# Patient Record
Sex: Female | Born: 1955 | Race: Black or African American | Hispanic: No | Marital: Married | Smoking: Former smoker
Health system: Southern US, Community
[De-identification: ages and names within clinical notes are randomized; demographics above are authoritative.]

## PROBLEM LIST (undated history)

## (undated) DIAGNOSIS — S32040A Wedge compression fracture of fourth lumbar vertebra, initial encounter for closed fracture: Secondary | ICD-10-CM

## (undated) DIAGNOSIS — K219 Gastro-esophageal reflux disease without esophagitis: Secondary | ICD-10-CM

## (undated) DIAGNOSIS — C7931 Secondary malignant neoplasm of brain: Secondary | ICD-10-CM

## (undated) DIAGNOSIS — R569 Unspecified convulsions: Secondary | ICD-10-CM

## (undated) DIAGNOSIS — C7951 Secondary malignant neoplasm of bone: Secondary | ICD-10-CM

## (undated) DIAGNOSIS — R627 Adult failure to thrive: Secondary | ICD-10-CM

## (undated) DIAGNOSIS — C7A1 Malignant poorly differentiated neuroendocrine tumors: Secondary | ICD-10-CM

## (undated) DIAGNOSIS — E876 Hypokalemia: Secondary | ICD-10-CM

## (undated) DIAGNOSIS — K7689 Other specified diseases of liver: Secondary | ICD-10-CM

## (undated) DIAGNOSIS — T7840XA Allergy, unspecified, initial encounter: Secondary | ICD-10-CM

## (undated) DIAGNOSIS — I82409 Acute embolism and thrombosis of unspecified deep veins of unspecified lower extremity: Secondary | ICD-10-CM

## (undated) DIAGNOSIS — K769 Liver disease, unspecified: Secondary | ICD-10-CM

## (undated) DIAGNOSIS — C787 Secondary malignant neoplasm of liver and intrahepatic bile duct: Secondary | ICD-10-CM

## (undated) DIAGNOSIS — K5900 Constipation, unspecified: Secondary | ICD-10-CM

## (undated) HISTORY — DX: Secondary malignant neoplasm of bone: C79.51

## (undated) HISTORY — DX: Malignant poorly differentiated neuroendocrine tumors: C7A.1

## (undated) HISTORY — PX: ANTERIOR LUMBAR CORPECTOMY W/ FUSION: SHX1167

## (undated) HISTORY — DX: Secondary malignant neoplasm of brain: C79.31

## (undated) HISTORY — DX: Unspecified convulsions: R56.9

## (undated) HISTORY — DX: Hypokalemia: E87.6

## (undated) HISTORY — PX: TOTAL SHOULDER ARTHROPLASTY: SHX126

## (undated) HISTORY — DX: Other specified diseases of liver: K76.89

## (undated) HISTORY — PX: OTHER SURGICAL HISTORY: SHX169

## (undated) HISTORY — DX: Allergy, unspecified, initial encounter: T78.40XA

## (undated) HISTORY — PX: THORACIC LAMINECTOMY: SHX96

## (undated) HISTORY — DX: Acute embolism and thrombosis of unspecified deep veins of unspecified lower extremity: I82.409

## (undated) HISTORY — DX: Constipation, unspecified: K59.00

## (undated) HISTORY — DX: Gastro-esophageal reflux disease without esophagitis: K21.9

## (undated) HISTORY — DX: Liver disease, unspecified: K76.9

## (undated) HISTORY — DX: Hypomagnesemia: E83.42

## (undated) HISTORY — DX: Adult failure to thrive: R62.7

## (undated) HISTORY — DX: Wedge compression fracture of fourth lumbar vertebra, initial encounter for closed fracture: S32.040A

## (undated) HISTORY — DX: Secondary malignant neoplasm of liver and intrahepatic bile duct: C78.7

---

## 2016-01-17 DIAGNOSIS — R918 Other nonspecific abnormal finding of lung field: Secondary | ICD-10-CM | POA: Insufficient documentation

## 2016-01-17 DIAGNOSIS — G9389 Other specified disorders of brain: Secondary | ICD-10-CM | POA: Insufficient documentation

## 2016-01-17 DIAGNOSIS — R569 Unspecified convulsions: Secondary | ICD-10-CM | POA: Insufficient documentation

## 2016-01-31 DIAGNOSIS — C787 Secondary malignant neoplasm of liver and intrahepatic bile duct: Secondary | ICD-10-CM | POA: Insufficient documentation

## 2016-01-31 DIAGNOSIS — C7A8 Other malignant neuroendocrine tumors: Secondary | ICD-10-CM | POA: Insufficient documentation

## 2016-01-31 DIAGNOSIS — C7931 Secondary malignant neoplasm of brain: Secondary | ICD-10-CM | POA: Insufficient documentation

## 2016-02-07 DIAGNOSIS — C7951 Secondary malignant neoplasm of bone: Secondary | ICD-10-CM | POA: Insufficient documentation

## 2016-02-14 DIAGNOSIS — E876 Hypokalemia: Secondary | ICD-10-CM | POA: Insufficient documentation

## 2016-04-14 DIAGNOSIS — M4856XA Collapsed vertebra, not elsewhere classified, lumbar region, initial encounter for fracture: Secondary | ICD-10-CM | POA: Insufficient documentation

## 2016-05-16 DIAGNOSIS — M4326 Fusion of spine, lumbar region: Secondary | ICD-10-CM | POA: Insufficient documentation

## 2016-08-28 DIAGNOSIS — L24A9 Irritant contact dermatitis due friction or contact with other specified body fluids: Secondary | ICD-10-CM | POA: Insufficient documentation

## 2016-08-28 DIAGNOSIS — T148XXA Other injury of unspecified body region, initial encounter: Secondary | ICD-10-CM | POA: Insufficient documentation

## 2016-09-01 DIAGNOSIS — Z9889 Other specified postprocedural states: Secondary | ICD-10-CM | POA: Insufficient documentation

## 2017-12-10 ENCOUNTER — Encounter (HOSPITAL_COMMUNITY): Payer: Self-pay | Admitting: Nurse Practitioner

## 2017-12-10 ENCOUNTER — Emergency Department (HOSPITAL_COMMUNITY)
Admission: EM | Admit: 2017-12-10 | Discharge: 2017-12-10 | Disposition: A | Discharge status: Home / Self Care | Payer: BLUE CROSS/BLUE SHIELD | Attending: Emergency Medicine | Admitting: Emergency Medicine

## 2017-12-10 ENCOUNTER — Other Ambulatory Visit: Payer: Self-pay

## 2017-12-10 ENCOUNTER — Emergency Department (HOSPITAL_COMMUNITY): Payer: BLUE CROSS/BLUE SHIELD

## 2017-12-10 DIAGNOSIS — R410 Disorientation, unspecified: Secondary | ICD-10-CM | POA: Diagnosis not present

## 2017-12-10 DIAGNOSIS — C7931 Secondary malignant neoplasm of brain: Secondary | ICD-10-CM | POA: Diagnosis not present

## 2017-12-10 DIAGNOSIS — T7621XA Adult sexual abuse, suspected, initial encounter: Secondary | ICD-10-CM | POA: Diagnosis present

## 2017-12-10 DIAGNOSIS — Z79899 Other long term (current) drug therapy: Secondary | ICD-10-CM | POA: Insufficient documentation

## 2017-12-10 MED ORDER — RIVAROXABAN 20 MG PO TABS: 20.0000 mg | ORAL_TABLET | Freq: Every day | ORAL | Status: DC

## 2017-12-10 MED ORDER — SODIUM CHLORIDE 0.9 % IJ SOLN
20.00 | INTRAMUSCULAR | Status: DC
Start: 2017-12-09 — End: 2017-12-10

## 2017-12-10 MED ORDER — ACETAMINOPHEN 325 MG PO TABS
650.0000 mg | ORAL_TABLET | Freq: Once | ORAL | Status: DC
  Filled 2017-12-10: qty 2

## 2017-12-10 MED ORDER — LEVETIRACETAM 500 MG PO TABS
1000.0000 mg | ORAL_TABLET | Freq: Once | ORAL | Status: AC
  Administered 2017-12-10: 1000 mg via ORAL
  Filled 2017-12-10: qty 2

## 2017-12-10 MED ORDER — MAGNESIUM OXIDE 400 MG PO TABS
400.00 | ORAL_TABLET | ORAL | Status: DC
Start: 2017-12-09 — End: 2017-12-10

## 2017-12-10 MED ORDER — ONDANSETRON HCL 8 MG PO TABS
8.00 | ORAL_TABLET | ORAL | Status: DC
Start: ? — End: 2017-12-10

## 2017-12-10 MED ORDER — RIVAROXABAN 20 MG PO TABS
20.00 | ORAL_TABLET | ORAL | Status: DC
Start: 2017-12-09 — End: 2017-12-10

## 2017-12-10 MED ORDER — QUETIAPINE FUMARATE 25 MG PO TABS
12.50 | ORAL_TABLET | ORAL | Status: DC
Start: 2017-12-09 — End: 2017-12-10

## 2017-12-10 MED ORDER — DEXAMETHASONE 2 MG PO TABS
1.00 | ORAL_TABLET | ORAL | Status: DC
Start: 2017-12-10 — End: 2017-12-10

## 2017-12-10 MED ORDER — RIVAROXABAN 20 MG PO TABS
20.0000 mg | ORAL_TABLET | Freq: Once | ORAL | Status: AC
  Administered 2017-12-10: 20 mg via ORAL
  Filled 2017-12-10: qty 1

## 2017-12-10 MED ORDER — SODIUM CHLORIDE 0.9 % IJ SOLN
20.00 | INTRAMUSCULAR | Status: DC
Start: ? — End: 2017-12-10

## 2017-12-10 MED ORDER — LACTULOSE 10 GM/15ML PO SOLN
10.00 g | ORAL | Status: DC
Start: 2017-12-09 — End: 2017-12-10

## 2017-12-10 MED ORDER — LEVETIRACETAM 500 MG PO TABS
1000.00 | ORAL_TABLET | ORAL | Status: DC
Start: 2017-12-09 — End: 2017-12-10

## 2017-12-10 MED ORDER — PANTOPRAZOLE SODIUM 40 MG PO TBEC
40.00 | DELAYED_RELEASE_TABLET | ORAL | Status: DC
Start: 2017-12-10 — End: 2017-12-10

## 2017-12-10 NOTE — Discharge Instructions (Signed)
Follow up as needed
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
## 2017-12-10 NOTE — ED Notes (Signed)
[place] concerning [facility] pickup for patient, patient is still on the list for pick up but no ETA is able to be given.
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
## 2017-12-10 NOTE — ED Provider Notes (Signed)
I was asked by RN to order medications that patient typically takes at night for patient. Patient is awaiting [facility] transfer and has been waiting for approximately 5 hours.  Patient requires Keppra and Xarelto. These are the most urgent doses for patient. Will order these for patient.   Albesa Seen, PA-C 12/11/17 [id number]    Charlesetta Shanks, MD 12/12/17 1012
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
## 2017-12-10 NOTE — ED Notes (Signed)
[facility] updated on transportation for patient

## 2017-12-11 ENCOUNTER — Non-Acute Institutional Stay (SKILLED_NURSING_FACILITY): Payer: BLUE CROSS/BLUE SHIELD | Admitting: Adult Health

## 2017-12-11 ENCOUNTER — Encounter: Payer: Self-pay | Admitting: Adult Health

## 2017-12-11 DIAGNOSIS — C7931 Secondary malignant neoplasm of brain: Secondary | ICD-10-CM

## 2017-12-11 DIAGNOSIS — C7951 Secondary malignant neoplasm of bone: Secondary | ICD-10-CM

## 2017-12-11 DIAGNOSIS — K219 Gastro-esophageal reflux disease without esophagitis: Secondary | ICD-10-CM | POA: Diagnosis not present

## 2017-12-11 DIAGNOSIS — C787 Secondary malignant neoplasm of liver and intrahepatic bile duct: Secondary | ICD-10-CM

## 2017-12-11 DIAGNOSIS — R569 Unspecified convulsions: Secondary | ICD-10-CM

## 2017-12-11 DIAGNOSIS — C7A8 Other malignant neuroendocrine tumors: Secondary | ICD-10-CM | POA: Diagnosis not present

## 2017-12-11 NOTE — Progress Notes (Signed)
Location:   [place] Room Number: [place] of Service:  SNF (31)   CODE STATUS: Full Code  No Known Allergies  Chief Complaint  Patient presents with  . 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 She is no longer a candidate for chemo or radiation therapy and has less than 6 months to live. The goal of her care is for rehab while able and then transition to comfort care. She will need a palliative care consult at this time. She is unable to participate in the hpi or ros.    Past Medical History:  Diagnosis Date  . Failure to thrive in adult   . GERD (gastroesophageal reflux disease)   . Seizures ([place])     History reviewed. No pertinent surgical history.  Social History   Socioeconomic History  . Marital status: Married    Spouse name: Not on file  . Number of children: Not on file  . Years of education: Not on file  . Highest education level: Not on file  Social Needs  . Financial resource strain: Not on file  . Food insecurity - worry: Not on file  . Food insecurity - inability: Not on file  . Transportation needs - medical: Not on file  . Transportation needs - non-medical: Not on file  Occupational History  . Not on file  Tobacco Use  . Smoking status: Unknown If Ever Smoked  . Smokeless tobacco: Never Used  Substance and Sexual Activity  . Alcohol use: No    Frequency: Never  . Drug use: No  . Sexual activity: Not on file  Other Topics Concern  . Not on file  Social History Narrative  . Not on file   History reviewed. No pertinent family history.    VITAL SIGNS BP 118/88   Pulse (!) 102   Temp 98.1 F (36.7 C)   Resp 18   Ht 5' 5"  (1.651 m)   Wt 240 lb (108.9 kg) Comment: from hospital  BMI 39.94 kg/m   Outpatient Encounter Medications as of 12/11/2017  Medication Sig  . alum & mag hydroxide-simeth  (MAG-AL PLUS XS) 400-400-40 MG/5ML suspension Take 15 mLs by mouth every 8 (eight) hours as needed for indigestion.  . calcium-vitamin D (OSCAL WITH D) 500-200 MG-UNIT tablet Take 1 tablet by mouth 2 (two) times daily.  Marland Kitchen dexamethasone (DECADRON) 1 MG tablet Take 1 mg by mouth daily with breakfast.  . lactulose (CHRONULAC) 10 GM/15ML solution Take 10 g by mouth 2 (two) times daily.  Marland Kitchen lidocaine-prilocaine (EMLA) cream Apply 1 application topically daily as needed (for port access).  Marland Kitchen omeprazole (PRILOSEC) 20 MG capsule Take 20 mg by mouth daily with breakfast.  . ondansetron (ZOFRAN) 8 MG tablet Take 8 mg by mouth every 8 (eight) hours as needed for nausea or vomiting.  . rivaroxaban (XARELTO) 20 MG TABS tablet Take 20 mg by mouth daily with supper.  . senna-docusate (SENOKOT-S) 8.6-50 MG tablet Take 2 tablets by mouth daily as needed for mild constipation.  . levETIRAcetam (KEPPRA) 1000 MG tablet Take 1,000 mg by mouth 2 (two) times daily.   No facility-administered encounter medications on file as of 12/11/2017.      SIGNIFICANT DIAGNOSTIC EXAMS  TODAY:   11-27-17: MRI of brain: 1. Again seen are 7 intracranial metastatic masses. Evaluation for progression is hindered  by the lack of contrast on the prior exam and motion limited exam today. There has been mild interval enlargement of a few of the lesions as described above mainly due to increased size of the centrally necrotic/hemorrhagic components. This enlargement is mild and there is no new solid enhancing nodularity to definitely confirm progression of disease as this may be treatment related. Consider attention on follow-up imaging. 2. 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 Mild improvement in edema on the bilateral frontal lobes could represent improving cerebral edema from the adjacent metastatic disease. 3. No acute infarct.  11-28-17: MRI liver 1.  Interval development of numerous hepatic lesions with interval increase in size of lesions from 10/22/2017, most consistent with progression of metastasis. 2. New 1.5 cm T10 vertebral body lesion, reflecting metastasis. 3. Interval development of additional foci of the restricted diffusion in the spleen, which are also concerning for metastasis.    LABS REVIEWED: TODAY:   11-30-17: vit B 12: 715; ammonia 54; tsh 1.589;  12-04-17: mag 1.8 12-07-17: wbc 5.6; hgb 11.8; hct 36.0; mcv 92.3; plt 242; glucose 92; bun 24; creat 0.68; k+ 4.5; na++ 138; ca 9.1; ast 79; alt 82; alk phos 268; albumin 3.5 12-09-17: wbc 4.8; hgb 11.9; hct 34.9; mcv 91.4; plt 224; glucose 81; bun 17; creat 0.63; k+ 4.1; na++ 135; ca 8.9; ast 81; alt 81; alk phos 271; albumin 3.3    Review of Systems  Unable to perform ROS: Other (confused )     Physical Exam  Constitutional: She appears well-developed and well-nourished. No distress.  Obese   Neck: No thyromegaly present.  Cardiovascular: Regular rhythm, normal heart sounds and intact distal pulses.  Tachycardic   Pulmonary/Chest: Effort normal. No respiratory distress.  Breath sounds diminished   Abdominal: Soft. Bowel sounds are normal. She exhibits distension. There is no tenderness.  Fluid present   Musculoskeletal: Normal range of motion. She exhibits no edema.  Lymphadenopathy:    She has no cervical adenopathy.  Neurological:  She is aware   Skin: Skin is warm and dry. She is not diaphoretic.    ASSESSMENT/ PLAN:  TODAY:   1. gerd without esophagitis: stable will continue prilosec 20 mg daily   2. Seizures: no reports of seizure activity present will continue keppra 1 gm twice daily   3. Neuroendocrine carcinoma of lung with liver; brain and osseous mets: she is declining: will continue decadron 1 mg daily and lactulose 15 mL twice daily   Time spent with patient and family: 45 minutes: we did discuss her poor prognosis; goals of care;  expectations and future care. Family verbalized understanding.   MD is aware of resident's narcotic use and is in agreement with current plan of care. We will attempt to wean resident as apropriate   Ok Edwards NP [facility]  Contact [phone] Monday through Friday 8am- 5pm  After hours call [phone]

## 2017-12-14 ENCOUNTER — Encounter: Payer: Self-pay | Admitting: Internal Medicine

## 2017-12-14 ENCOUNTER — Other Ambulatory Visit: Payer: Self-pay

## 2017-12-14 ENCOUNTER — Non-Acute Institutional Stay (SKILLED_NURSING_FACILITY): Payer: BLUE CROSS/BLUE SHIELD | Admitting: Internal Medicine

## 2017-12-14 DIAGNOSIS — C787 Secondary malignant neoplasm of liver and intrahepatic bile duct: Secondary | ICD-10-CM

## 2017-12-14 DIAGNOSIS — G893 Neoplasm related pain (acute) (chronic): Secondary | ICD-10-CM

## 2017-12-14 DIAGNOSIS — C7951 Secondary malignant neoplasm of bone: Secondary | ICD-10-CM

## 2017-12-14 DIAGNOSIS — C7800 Secondary malignant neoplasm of unspecified lung: Secondary | ICD-10-CM | POA: Diagnosis not present

## 2017-12-14 DIAGNOSIS — C7931 Secondary malignant neoplasm of brain: Secondary | ICD-10-CM | POA: Diagnosis not present

## 2017-12-14 DIAGNOSIS — R740 Nonspecific elevation of levels of transaminase and lactic acid dehydrogenase [LDH]: Secondary | ICD-10-CM

## 2017-12-14 DIAGNOSIS — D6481 Anemia due to antineoplastic chemotherapy: Secondary | ICD-10-CM

## 2017-12-14 DIAGNOSIS — R569 Unspecified convulsions: Secondary | ICD-10-CM | POA: Diagnosis not present

## 2017-12-14 DIAGNOSIS — R4182 Altered mental status, unspecified: Secondary | ICD-10-CM

## 2017-12-14 DIAGNOSIS — R7401 Elevation of levels of liver transaminase levels: Secondary | ICD-10-CM

## 2017-12-14 DIAGNOSIS — T451X5A Adverse effect of antineoplastic and immunosuppressive drugs, initial encounter: Secondary | ICD-10-CM

## 2017-12-14 DIAGNOSIS — E440 Moderate protein-calorie malnutrition: Secondary | ICD-10-CM

## 2017-12-14 DIAGNOSIS — R1310 Dysphagia, unspecified: Secondary | ICD-10-CM

## 2017-12-14 MED ORDER — MORPHINE SULFATE (CONCENTRATE) 20 MG/ML PO SOLN: ORAL | 0 refills | Status: AC
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
## 2017-12-14 NOTE — Telephone Encounter (Signed)
RX faxed to [facility] @ [phone], phone number [phone] 

## 2017-12-15 LAB — BASIC METABOLIC PANEL
BUN: 50 — AB (ref 4–21)
CREATININE: 1.9 — AB (ref 0.5–1.1)
Glucose: 93
Potassium: 5.7 — AB (ref 3.4–5.3)
Sodium: 138 (ref 137–147)

## 2017-12-15 NOTE — ED Notes (Signed)
Admin Chip at facility called to try and find out why a SANE exam was not preformed. Opened chart to try and determine what happened.

## 2017-12-16 ENCOUNTER — Non-Acute Institutional Stay (SKILLED_NURSING_FACILITY): Payer: BLUE CROSS/BLUE SHIELD | Admitting: Adult Health

## 2017-12-16 ENCOUNTER — Encounter: Payer: Self-pay | Admitting: Adult Health

## 2017-12-16 DIAGNOSIS — C787 Secondary malignant neoplasm of liver and intrahepatic bile duct: Secondary | ICD-10-CM

## 2017-12-16 DIAGNOSIS — C7951 Secondary malignant neoplasm of bone: Secondary | ICD-10-CM

## 2017-12-16 DIAGNOSIS — C7A8 Other malignant neuroendocrine tumors: Secondary | ICD-10-CM

## 2017-12-16 DIAGNOSIS — C7931 Secondary malignant neoplasm of brain: Secondary | ICD-10-CM

## 2017-12-27 DIAGNOSIS — K219 Gastro-esophageal reflux disease without esophagitis: Secondary | ICD-10-CM | POA: Insufficient documentation

## 2018-01-15 NOTE — Progress Notes (Signed)
Location:   [place] Room Number: [place] of Service:  SNF (31)   CODE STATUS: Full Code  No Known Allergies  Chief Complaint  Patient presents with  . Acute Visit    Change in status    HPI:  She has had a significant change in her status. She is no longer responsive to stimuli; she went from showing signs of distress; to on signs of distress in the past hour. Palliative care is present; family is present: we have had a prolonged discussion that her status is terminal and she has less that 24 hours of life left. Her husband and children have verbalized understanding. They have decided to change her code status to DNR.   Past Medical History:  Diagnosis Date  . Allergy   . Brain metastases ([place])   . Closed compression fracture of L4 lumbar vertebra ([place])   . Constipation   . DVT, lower extremity, recurrent, unspecified laterality ([place])   . Failure to thrive in adult   . GERD (gastroesophageal reflux disease)   . Hepatic dysfunction   . 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 Hypokalemia   . Hypomagnesemia   . Liver metastases ([place])   . Osseous metastasis ([place])   . Seizures ([place])     Past Surgical History:  Procedure Laterality Date  . ANTERIOR LUMBAR CORPECTOMY W/ FUSION    . lumbar compression    . orif right leg    . THORACIC LAMINECTOMY    . TOTAL SHOULDER ARTHROPLASTY      Social History   Socioeconomic History  . Marital status: Married    Spouse name: Not on file  . Number of children: Not on file  . Years of education: Not on file  . Highest education level: Not on file  Social Needs  . Financial resource strain: Not on file  . Food insecurity - worry: Not on file  . Food insecurity - inability: Not on file  . Transportation needs - medical: Not on file  . Transportation needs - non-medical: Not on file  Occupational History  . Not on file  Tobacco Use  . Smoking status: Former Research scientist (life sciences)  . Smokeless tobacco: Never  Used  Substance and Sexual Activity  . Alcohol use: No    Frequency: Never  . Drug use: No  . Sexual activity: Not on file  Other Topics Concern  . Not on file  Social History Narrative  . Not on file   History reviewed. No pertinent family history.    VITAL SIGNS Ht 5' 5"  (1.651 m)   Wt 240 lb 1.3 oz (108.9 kg)   BMI 39.95 kg/m    Outpatient Encounter Medications as of 03-Jan-2018  Medication Sig  . alum & mag hydroxide-simeth (MAG-AL PLUS XS) 400-400-40 MG/5ML suspension Take 15 mLs by mouth every 8 (eight) hours as needed for indigestion.  . calcium-vitamin D (OSCAL WITH D) 500-200 MG-UNIT tablet Take 1 tablet by mouth 2 (two) times daily.  Marland Kitchen dexamethasone (DECADRON) 1 MG tablet Take 1 mg by mouth daily with breakfast.  . lactulose (CHRONULAC) 10 GM/15ML solution Take 10 g by mouth 2 (two) times daily.  Marland Kitchen levETIRAcetam (KEPPRA) 100 MG/ML solution Give 10 ml by mouth two times a day  . lidocaine-prilocaine (EMLA) cream Apply 1 application topically daily as needed (for port access).  . morphine (ROXANOL) 20 MG/ML concentrated solution Give 0.25 ml (60m) by mouth every 4 hours as needed for  moderate / severe pain / headache  . omeprazole (PRILOSEC) 20 MG capsule Take 20 mg by mouth daily with breakfast.  . ondansetron (ZOFRAN) 8 MG tablet Take 8 mg by mouth every 8 (eight) hours as needed for nausea or vomiting.  . rivaroxaban (XARELTO) 20 MG TABS tablet Take 20 mg by mouth daily with supper.  . senna-docusate (SENOKOT-S) 8.6-50 MG tablet Take 2 tablets by mouth daily as needed for mild constipation.   No facility-administered encounter medications on file as of 01-07-2018.      SIGNIFICANT DIAGNOSTIC EXAMS  PREVIOUS:   11-27-17: MRI of brain: 1. Again seen are 7 intracranial metastatic masses. Evaluation for progression is hindered by the lack of contrast on the prior exam and motion limited exam today. There has been mild interval enlargement of a few of the lesions as  described above mainly due to increased size of the centrally necrotic/hemorrhagic components. This enlargement is mild and there is no new solid enhancing nodularity to definitely confirm progression of disease as this may be treatment related. Consider attention on follow-up imaging. 2. 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 Mild improvement in edema on the bilateral frontal lobes could represent improving cerebral edema from the adjacent metastatic disease. 3. No acute infarct.  11-28-17: MRI liver 1. Interval development of numerous hepatic lesions with interval increase in size of lesions from 10/22/2017, most consistent with progression of metastasis. 2. New 1.5 cm T10 vertebral body lesion, reflecting metastasis. 3. Interval development of additional foci of the restricted diffusion in the spleen, which are also concerning for metastasis.   NO NEW EXAMS   LABS REVIEWED: PREVIOUS:   11-30-17: vit B 12: 715; ammonia 54; tsh 1.589;  12-04-17: mag 1.8 12-07-17: wbc 5.6; hgb 11.8; hct 36.0; mcv 92.3; plt 242; glucose 92; bun 24; creat 0.68; k+ 4.5; na++ 138; ca 9.1; ast 79; alt 82; alk phos 268; albumin 3.5 12-09-17: wbc 4.8; hgb 11.9; hct 34.9; mcv 91.4; plt 224; glucose 81; bun 17; creat 0.63; k+ 4.1; na++ 135; ca 8.9; ast 81; alt 81; alk phos 271; albumin 3.3  NO NEW LABS     Review of Systems  Unable to perform ROS: Other (non responsive )    Physical Exam  Constitutional: No distress.  Cardiovascular:  Heart rate is tachycardic   Pulmonary/Chest: Breath sounds normal. No respiratory distress.  Shallow respirations   Abdominal: She exhibits distension.  Fluid present   Musculoskeletal: She exhibits no edema.  Neurological:  Not aware of surroundings   Skin: She is not diaphoretic.    ASSESSMENT/ PLAN:  TODAY:   1. Neuroendocrine carcinoma of lung with mets to liver; bone and brain;  Will  stop meds Will begin roxanol 5 mg every 4 hours routinely and every hour as needed Will begin ativan 0.5 mg every 4 hours as needed   ADENDUM   SHE HAS EXPIRED ONE HOUR AFTER SEEING HER FOR THIS PROGRESS NOTE    MD is aware of resident's narcotic use and is in agreement with current plan of care. We will attempt to wean resident as apropriate   Ok Edwards NP [facility]  Contact [phone] Monday through Friday 8am- 5pm  After hours call [phone]

## 2018-01-15 DEATH — deceased

## 2018-02-03 DIAGNOSIS — G893 Neoplasm related pain (acute) (chronic): Secondary | ICD-10-CM | POA: Insufficient documentation

## 2018-02-03 DIAGNOSIS — C7800 Secondary malignant neoplasm of unspecified lung: Secondary | ICD-10-CM | POA: Insufficient documentation

## 2018-02-03 DIAGNOSIS — D6481 Anemia due to antineoplastic chemotherapy: Secondary | ICD-10-CM | POA: Insufficient documentation

## 2018-02-03 DIAGNOSIS — R1310 Dysphagia, unspecified: Secondary | ICD-10-CM | POA: Insufficient documentation

## 2018-02-03 DIAGNOSIS — R740 Nonspecific elevation of levels of transaminase and lactic acid dehydrogenase [LDH]: Secondary | ICD-10-CM

## 2018-02-03 DIAGNOSIS — T451X5A Adverse effect of antineoplastic and immunosuppressive drugs, initial encounter: Secondary | ICD-10-CM

## 2018-02-03 DIAGNOSIS — R7401 Elevation of levels of liver transaminase levels: Secondary | ICD-10-CM | POA: Insufficient documentation

## 2018-05-27 IMAGING — CT CT HEAD W/O CM
3 series · 14 of 46 positions shown, 16 images · non-contrast
Comparison: 09/04/2017, 01/16/2016

CLINICAL DATA: 61-year-old female with a history of memory loss,
head trauma

EXAM:
CT HEAD WITHOUT CONTRAST
TECHNIQUE: Contiguous axial images were obtained from the base of the skull
through the vertex without intravenous contrast.

[Series 2: head wo · axial · 0.49mm/px · z∈[+1513,+1633]mm · 8 of 29 slices shown, 10 images]
[im 3/29  brain]
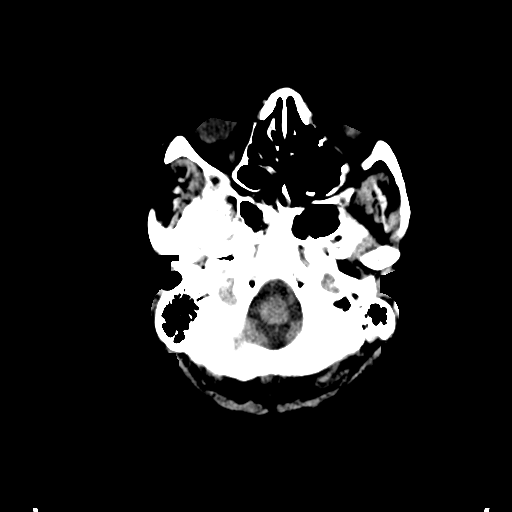
[im 3/29  bone]
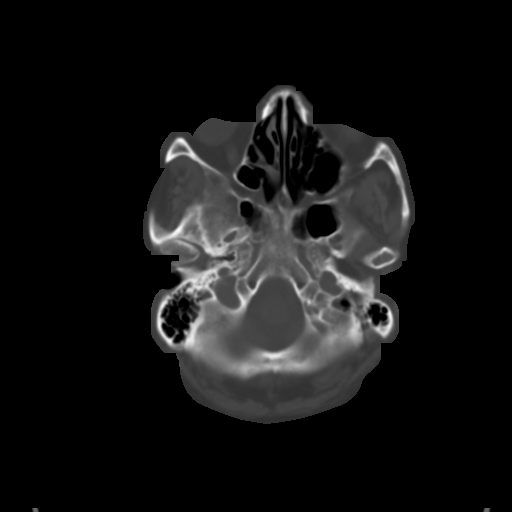
[im 7/29  brain]
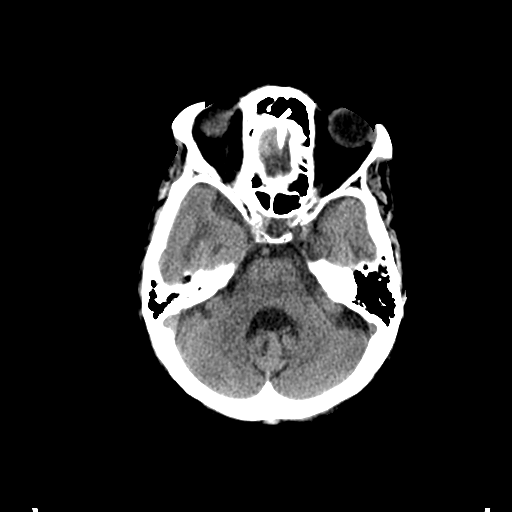
[im 10/29  brain]
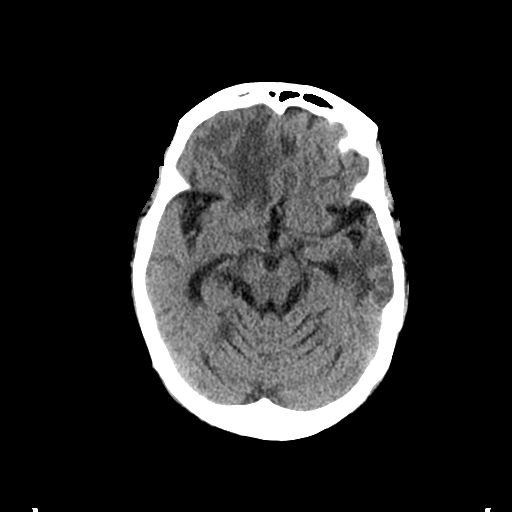
[im 13/29  brain]
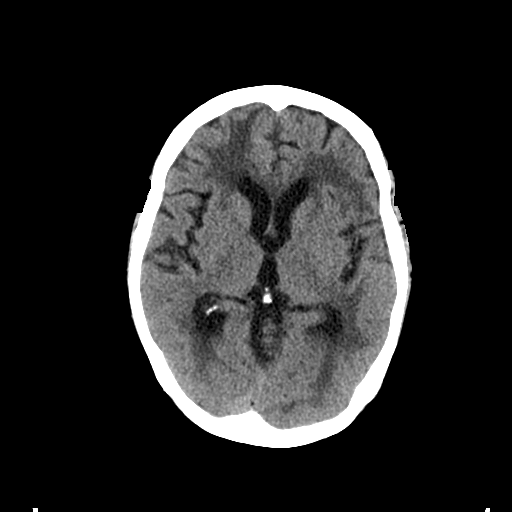
[im 17/29  brain]
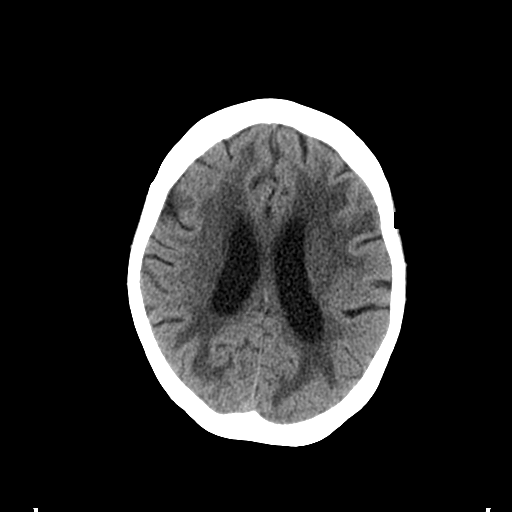
[im 17/29  bone]
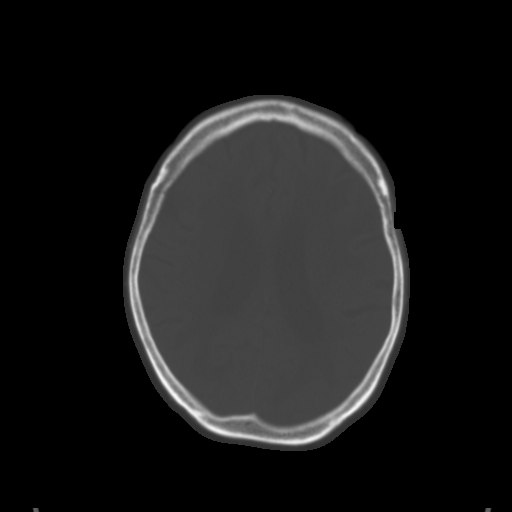
[im 20/29  brain]
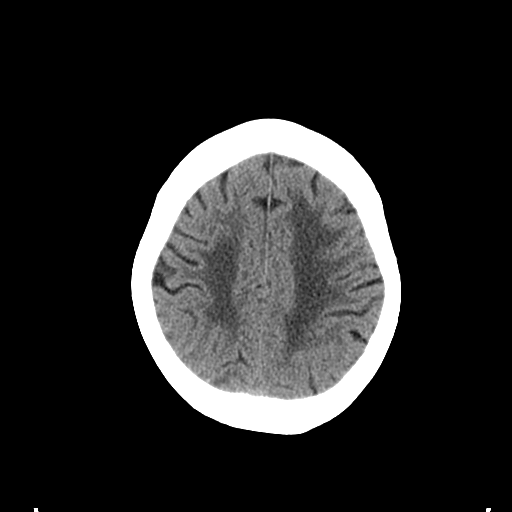
[im 23/29  brain]
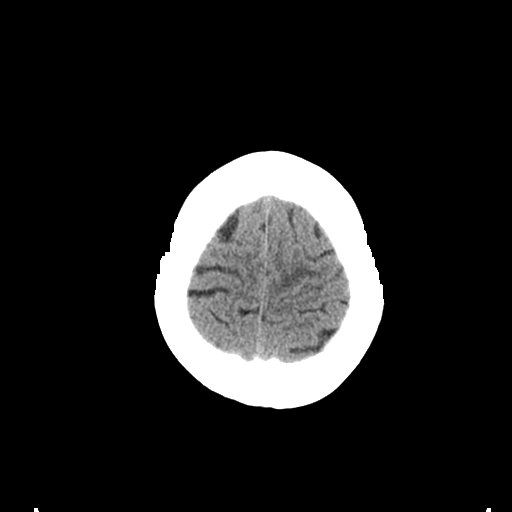
[im 27/29  brain]
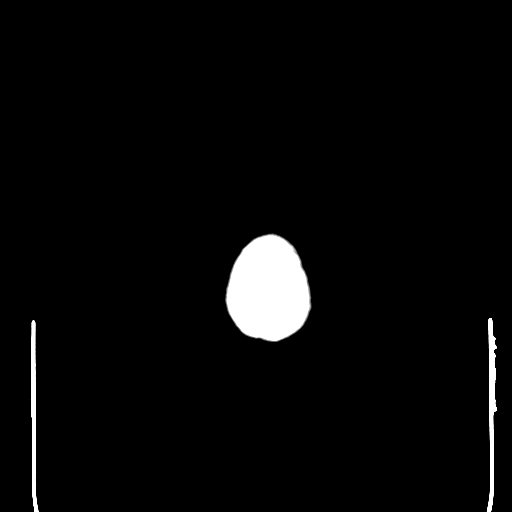

[Series 4: coronal soft tissue · coronal · 0.35mm/px · 3 of 60 slices shown]
[im 20/60  brain]
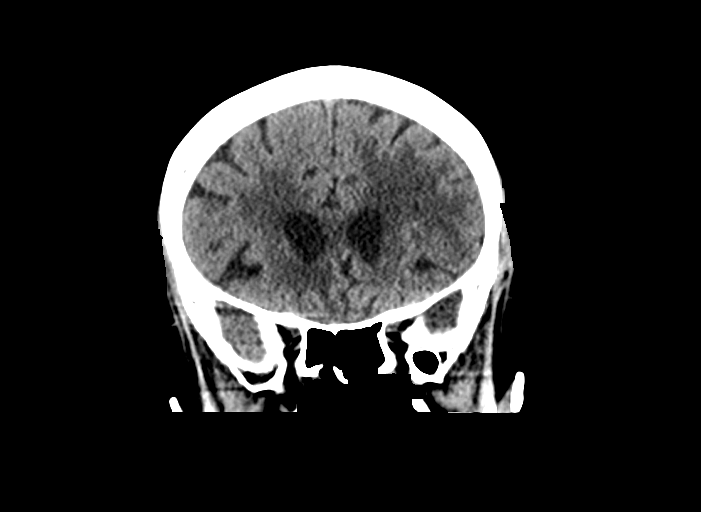
[im 27/60  brain]
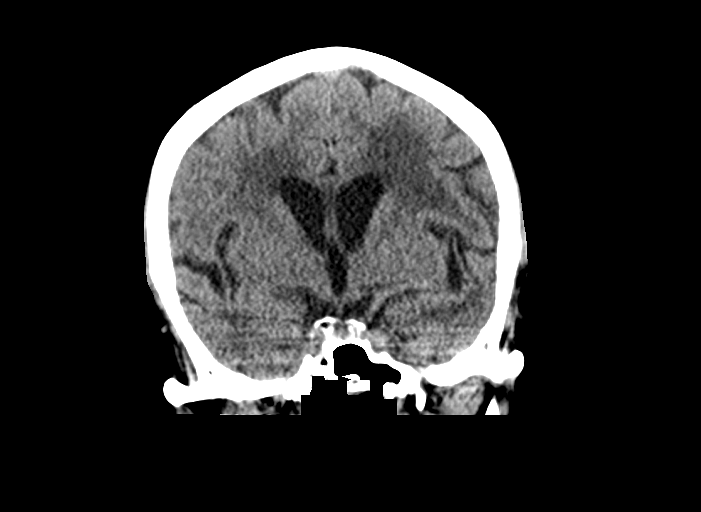
[im 33/60  brain]
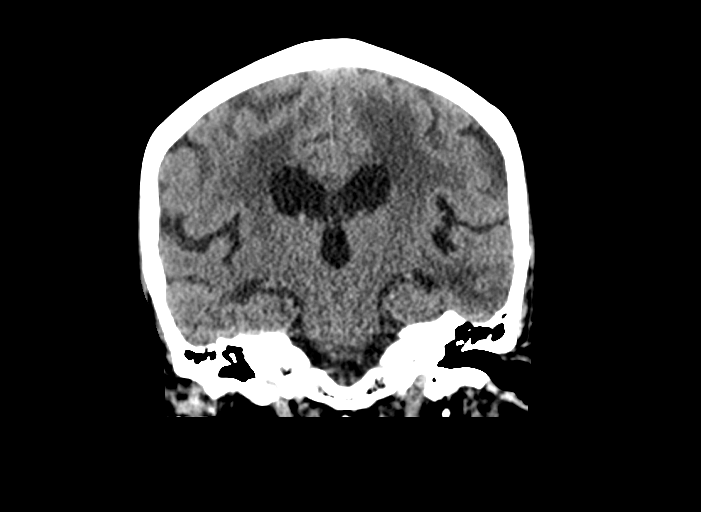

[Series 5: sagittal soft tissue · sagittal · 0.33mm/px · 3 of 47 slices shown]
[im 16/47  brain]
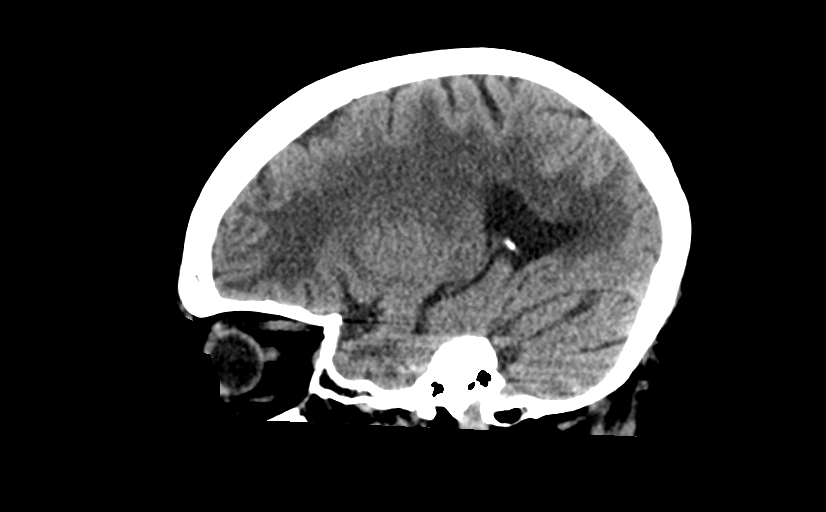
[im 24/47  brain]
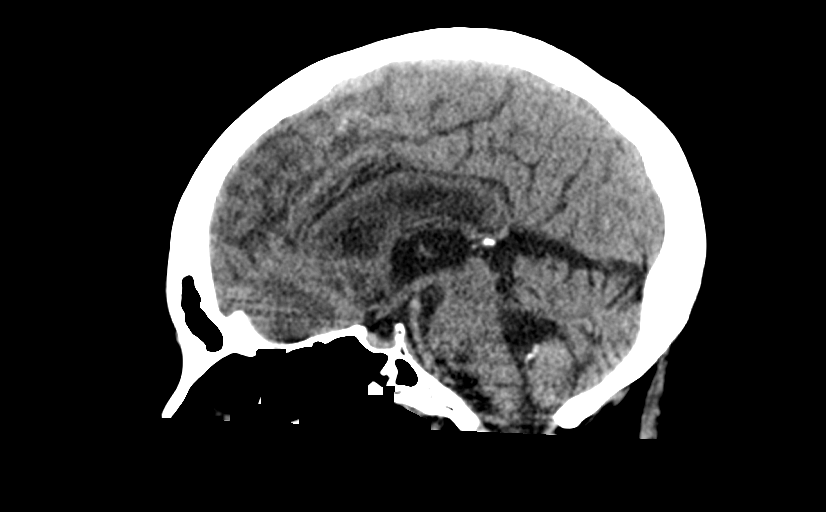
[im 31/47  brain]
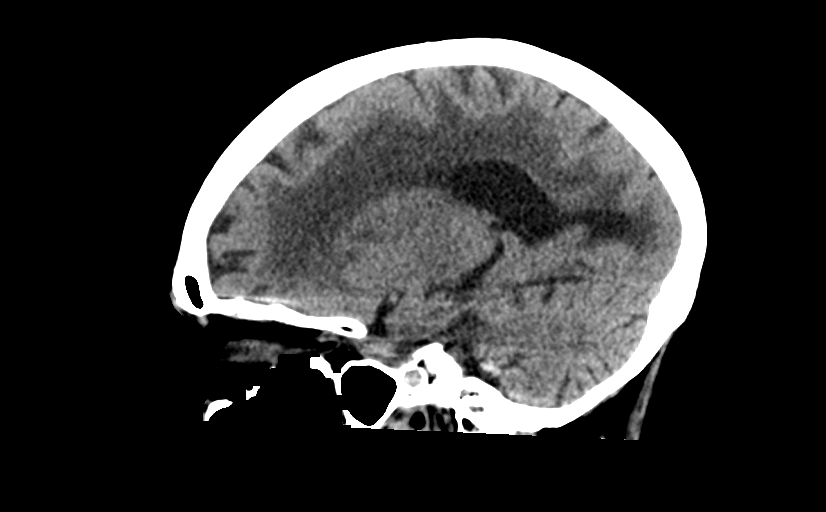

[14 of 46 positions shown; findings below may reference images not displayed]

FINDINGS: Brain: No acute intracranial hemorrhage. Configuration of the
ventricles similar to the comparison.

Confluent hypodensity in the periventricular white matter persists.
Hypodensity in the left temporal and right frontal region.

Calcified nodule in the right frontal cortex, with increasing degree
of calcifications at the site of prior tumor treatment.

There is leftward shift of the paramedian right frontal lobe at the
interhemispheric fissure of the anterior cranial fossa. There is a
focus of hyperdensity measuring 13 mm at the site of leftward shift.

Vascular: Calcifications of the anterior circulation.

Skull: No acute bony abnormality/fracture. No aggressive bony
lesions.

Sinuses/Orbits: Unremarkable appearance of the orbits. Unremarkable
sinuses.

Other: None
IMPRESSION: No acute intracranial hemorrhage.

Hyperdense focus involving the cortex of the right paramedian
frontal lobe with apparent local mass effect and potential vasogenic
edema. Differential includes cortical necrosis or potentially a
second tumor focus. Further evaluation with contrast-enhanced MRI is
recommended given the patient's history of prior brain tumor
treatment.

Treatment effects of prior right frontal cortical tumor, with
further calcifications and some degree of encephalomalacia.

Periventricular hypodensity, likely related to chronic microvascular
ischemic disease.
